# Patient Record
Sex: Male | Born: 1980 | Hispanic: Yes | Marital: Married | Smoking: Never smoker
Health system: Southern US, Community
[De-identification: ages and names within clinical notes are randomized; demographics above are authoritative.]

---

## 2007-05-14 ENCOUNTER — Emergency Department: Payer: Self-pay | Admitting: Emergency Medicine

## 2010-04-28 ENCOUNTER — Emergency Department: Payer: Self-pay | Admitting: Emergency Medicine

## 2015-10-01 ENCOUNTER — Emergency Department
Admission: EM | Admit: 2015-10-01 | Discharge: 2015-10-01 | Disposition: A | Discharge status: Home / Self Care | Payer: Self-pay | Attending: Emergency Medicine | Admitting: Emergency Medicine

## 2015-10-01 ENCOUNTER — Emergency Department: Payer: Self-pay

## 2015-10-01 ENCOUNTER — Encounter: Payer: Self-pay | Admitting: *Deleted

## 2015-10-01 DIAGNOSIS — R51 Headache: Secondary | ICD-10-CM | POA: Insufficient documentation

## 2015-10-01 DIAGNOSIS — G8929 Other chronic pain: Secondary | ICD-10-CM | POA: Insufficient documentation

## 2015-10-01 DIAGNOSIS — R519 Headache, unspecified: Secondary | ICD-10-CM

## 2015-10-01 MED ORDER — BUTALBITAL-APAP-CAFFEINE 50-325-40 MG PO TABS: 1.0000 | ORAL_TABLET | Freq: Four times a day (QID) | ORAL | Status: AC | PRN

## 2015-10-01 NOTE — ED Provider Notes (Signed)
[facility] Emergency Department Provider Note  ____________________________________________  Time seen: Approximately 1:34 PM  I have reviewed the triage vital signs and the nursing notes.   HISTORY  Chief Complaint Headache  HPI Troy Daniels is a 34 y.o. male is here complaining of posterior headache for 3 weeks. Patient states that every morning he wakes up with a headache and it lasts generally all day. He has been taking ibuprofen with minimal relief. He denies any nausea, vomiting or photophobia. He has not been seen for headaches prior to today. Patient is unaware of any heart problems as he does not have a primary care doctor. Patient denies any nasal congestion, ear pain, sore throat or sinus pain. Currently he rates his pain as 5 out of 10.   History reviewed. No pertinent past medical history.  There are no active problems to display for this patient.   History reviewed. No pertinent past surgical history.  Current Outpatient Rx  Name  Route  Sig  Dispense  Refill  . butalbital-acetaminophen-caffeine (FIORICET) 50-325-40 MG tablet   Oral   Take 1-2 tablets by mouth every 6 (six) hours as needed for headache.   20 tablet   0     Allergies Review of patient's allergies indicates no known allergies.  No family history on file.  Social History Social History  Substance Use Topics  . Smoking status: Never Smoker   . Smokeless tobacco: None  . Alcohol Use: Yes    Review of Systems Constitutional: No fever/chills Eyes: No visual changes. ENT: No sore throat. Cardiovascular: Denies chest pain. Respiratory: Denies shortness of breath. Gastrointestinal: No abdominal pain.  No nausea, no vomiting.  Genitourinary: Negative for dysuria. Musculoskeletal: Negative for back pain. Skin: Negative for rash. Neurological: Positive for headaches, no focal weakness or numbness.  10-point ROS otherwise  negative.  ____________________________________________   PHYSICAL EXAM:  VITAL SIGNS: ED Triage Vitals  Enc Vitals Group     BP 10/01/15 1252 138/97 mmHg     Pulse Rate 10/01/15 1252 92     Resp 10/01/15 1252 20     Temp 10/01/15 1250 98.6 F (37 C)     Temp Source 10/01/15 1250 Oral     SpO2 10/01/15 1252 97 %     Weight 10/01/15 1250 240 lb (108.863 kg)     Height 10/01/15 1250  (1.753 m)     Head Cir --      Peak Flow --      Pain Score 10/01/15 1251 5     Pain Loc --      Pain Edu? --      Excl. in GC? --     Constitutional: Alert and oriented. Well appearing and in no acute distress. Eyes: Conjunctivae are normal. PERRL. EOMI. Head: Atraumatic. Nose: No congestion/rhinnorhea. Mouth/Throat: Mucous membranes are moist.  Oropharynx non-erythematous. Neck: No stridor.  Supple. No cervical tenderness on palpation. Range of motion of the neck all 4 planes within normal limits. Hematological/Lymphatic/Immunilogical: No cervical lymphadenopathy. Cardiovascular: Normal rate, regular rhythm. Grossly normal heart sounds.  Good peripheral circulation. Respiratory: Normal respiratory effort.  No retractions. Lungs CTAB. Gastrointestinal: Soft and nontender. No distention.  Musculoskeletal: Moves upper and lower extremities without any difficulty. Normal gait was noted. Neurologic:  Normal speech and language. No gross focal neurologic deficits are appreciated. No gait instability. Cranial nerves II through XII grossly intact. Reflexes 2+ bilaterally. Skin:  Skin is warm, dry and intact. No rash noted. Psychiatric: Mood  and affect are normal. Speech and behavior are normal.  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 ____________________________________________   PROCEDURES  Procedure(s) performed: None  Critical Care performed: No  ____________________________________________   INITIAL IMPRESSION / ASSESSMENT AND PLAN / ED COURSE  Pertinent labs & imaging results that were available during my care of the patient were reviewed by me and considered in my medical decision making (see chart for details).  Patient was given a prescription for Fioricet to try to see if this helps with his headaches. He is to [facility] or the neurologist who is on call for today. Patient was made aware that he cannot drive or operate machinery while taking this medication. ____________________________________________   FINAL CLINICAL IMPRESSION(S) / ED DIAGNOSES  Final diagnoses:  Chronic nonintractable headache, unspecified headache type      Tommi RumpsRhonda L Jahari Wiginton, PA-C 10/01/15 1504  Sharman CheekPhillip Stafford, MD 10/01/15 1550

## 2015-10-01 NOTE — Discharge Instructions (Signed)
General Headache Without Cause A headache is pain or discomfort felt around the head or neck area. There are many causes and types of headaches. In some cases, the cause may not be found.  HOME CARE  Managing Pain  Take over-the-counter and prescription medicines only as told by your doctor.  Lie down in a dark, quiet room when you have a headache.  If directed, apply ice to the head and neck area:  Put ice in a plastic bag.  Place a towel between your skin and the bag.  Leave the ice on for 20 minutes, 2-3 times per day.  Use a heating pad or hot shower to apply heat to the head and neck area as told by your doctor.  Keep lights dim if bright lights bother you or make your headaches worse. Eating and Drinking  Eat meals on a regular schedule.  Lessen how much alcohol you drink.  Lessen how much caffeine you drink, or stop drinking caffeine. General Instructions  Keep all follow-up visits as told by your doctor. This is important.  Keep a journal to find out if certain things bring on headaches. For example, write down:  What you eat and drink.  How much sleep you get.  Any change to your diet or medicines.  Relax by getting a massage or doing other relaxing activities.  Lessen stress.  Sit up straight. Do not tighten (tense) your muscles.  Do not use tobacco products. This includes cigarettes, chewing tobacco, or e-cigarettes. If you need help quitting, ask your doctor.  Exercise regularly as told by your doctor.  Get enough sleep. This often means 7-9 hours of sleep. GET HELP IF:  Your symptoms are not helped by medicine.  You have a headache that feels different than the other headaches.  You feel sick to your stomach (nauseous) or you throw up (vomit).  You have a fever. GET HELP RIGHT AWAY IF:   Your headache becomes really bad.  You keep throwing up.  You have a stiff neck.  You have trouble seeing.  You have trouble speaking.  You have  pain in the eye or ear.  Your muscles are weak or you lose muscle control.  You lose your balance or have trouble walking.  You feel like you will pass out (faint) or you pass out.  You have confusion.   This information is not intended to replace advice given to you by your health care provider. Make sure you discuss any questions you have with your health care provider.   Document Released: 06/28/2008 Document Revised: 06/10/2015 Document Reviewed: 01/12/2015 Elsevier Interactive Patient Education 2016 [employer]    [facility] if any continued problems. There is also a neurologist listed on your discharge papers that he could follow-up with for your chronic headaches. Fioricet 1 or 2 every 6 hours if needed for headache.. Whether this could cause drowsiness and you should not be driving or operating machinery.

## 2015-10-01 NOTE — ED Notes (Signed)
Pt reports headache x 3 weeks in back of head.  Pt denies any injury, denies hx of migraines.  Pt states he wakes up every morning w/ headache and it last all day; reports taking ibuprofen w/ minimal relief.  Pt ambulatory to room; no immediate distress at this time.

## 2015-10-01 NOTE — ED Notes (Signed)
Past 3 weeks c/o intervals of headace back of head with some n/v, has headache now, took motrin with some relief, no vomiting today

## 2017-02-20 ENCOUNTER — Emergency Department
Admission: EM | Admit: 2017-02-20 | Discharge: 2017-02-20 | Disposition: A | Discharge status: Home / Self Care | Payer: No Typology Code available for payment source | Attending: Emergency Medicine | Admitting: Emergency Medicine

## 2017-02-20 ENCOUNTER — Emergency Department: Payer: No Typology Code available for payment source

## 2017-02-20 DIAGNOSIS — Y999 Unspecified external cause status: Secondary | ICD-10-CM | POA: Diagnosis not present

## 2017-02-20 DIAGNOSIS — S59902A Unspecified injury of left elbow, initial encounter: Secondary | ICD-10-CM | POA: Diagnosis present

## 2017-02-20 DIAGNOSIS — S5002XA Contusion of left elbow, initial encounter: Secondary | ICD-10-CM

## 2017-02-20 DIAGNOSIS — Y9366 Activity, soccer: Secondary | ICD-10-CM | POA: Insufficient documentation

## 2017-02-20 DIAGNOSIS — Y929 Unspecified place or not applicable: Secondary | ICD-10-CM | POA: Insufficient documentation

## 2017-02-20 DIAGNOSIS — S53402A Unspecified sprain of left elbow, initial encounter: Secondary | ICD-10-CM

## 2017-02-20 DIAGNOSIS — W1839XA Other fall on same level, initial encounter: Secondary | ICD-10-CM | POA: Insufficient documentation

## 2017-02-20 MED ORDER — HYDROCODONE-ACETAMINOPHEN 5-325 MG PO TABS
1.0000 | ORAL_TABLET | Freq: Once | ORAL | Status: AC
  Administered 2017-02-20: 1 via ORAL
  Filled 2017-02-20: qty 1

## 2017-02-20 MED ORDER — OXYCODONE-ACETAMINOPHEN 5-325 MG PO TABS
ORAL_TABLET | ORAL | Status: DC
Start: 2017-02-20 — End: 2017-02-20
  Filled 2017-02-20: qty 1

## 2017-02-20 MED ORDER — OXYCODONE-ACETAMINOPHEN 5-325 MG PO TABS
1.0000 | ORAL_TABLET | Freq: Once | ORAL | Status: AC
  Administered 2017-02-20: 1 via ORAL

## 2017-02-20 MED ORDER — TRAMADOL HCL 50 MG PO TABS: 50.0000 mg | ORAL_TABLET | Freq: Four times a day (QID) | ORAL | 0 refills | Status: AC | PRN

## 2017-02-20 NOTE — ED Notes (Signed)
Pt discharged to home.  Family member driving.  Discharge instructions reviewed.  Verbalized understanding.  No questions or concerns at this time.  Teach back verified.  Pt in NAD.  No items left in ED.   

## 2017-02-20 NOTE — ED Triage Notes (Signed)
Patient reports he fell onto left elbow at approx 1950 while playing soccer with his child. Pt reports pain of the entire left arm.

## 2017-02-21 NOTE — ED Provider Notes (Signed)
[facility] Emergency Department Provider Note   ____________________________________________   I have reviewed the triage vital signs and the nursing notes.   HISTORY  Chief Complaint Arm Injury (left arm)    HPI Troy Daniels is a 36 y.o. male presents with left elbow pain and inability to move his left arm since falling on his arm during a soccer match earlier this evening. Patient unable to recall how he landed on his arm. Sensation intact throughout his left upper extremity since the injury. Patient can range his wrist and shoulder however significantly increases elbow pain. Pt unable to fully flex, extend or supinate left elbow. Denies past injury to left upper extremity. Denies head, neck or back injury.    History reviewed. No pertinent past medical history.  There are no active problems to display for this patient.   History reviewed. No pertinent surgical history.  Prior to Admission medications   Medication Sig Start Date End Date Taking? Authorizing Provider  traMADol (ULTRAM) 50 MG tablet Take 1 tablet (50 mg total) by mouth every 6 (six) hours as needed. 02/20/17 02/20/18  [place] Witz, Jordan Likesraci M, PA-C    Allergies Patient has no known allergies.  History reviewed. No pertinent family history.  Social History Social History  Substance Use Topics  . Smoking status: Never Smoker  . Smokeless tobacco: Never Used  . Alcohol use Yes    Review of Systems Constitutional: Negative for fever/chills Eyes: No visual changes. Cardiovascular: Denies chest pain. Respiratory: Denies shortness of breath. Musculoskeletal: Left upper extremity pain, inability to move left elbow.  Skin: negative for rash. Neurological: Negative for headaches.  Negative focal weakness or numbness. Negative for loss of consciousness. Able  ____________________________________________   PHYSICAL EXAM:  VITAL SIGNS: ED Triage Vitals  Enc Vitals Group     BP 02/20/17  2135 122/84     Pulse Rate 02/20/17 2135 67     Resp 02/20/17 2135 20     Temp 02/20/17 2135 98.2 F (36.8 C)     Temp Source 02/20/17 2135 Oral     SpO2 02/20/17 2135 98 %     Weight 02/20/17 2129 240 lb (108.9 kg)     Height 02/20/17 2129 5\' 9"  (1.753 m)     Head Circumference --      Peak Flow --      Pain Score 02/20/17 2128 10     Pain Loc --      Pain Edu? --      Excl. in GC? --     Constitutional: Alert and oriented. Well appearing and in no acute distress.  Head: Normocephalic and atraumatic. Eyes: Conjunctivae are normal.  Cardiovascular: Normal rate, regular rhythm. Normal distal pulses. Respiratory: Normal respiratory effort.  Musculoskeletal: Full left shoulder and wrist ROM and strength intact. Left elbow pain, inability to fully extend, flex or supinate secondary to pain. Sensation intact left upper extremity.  Neurologic: Normal speech and language. No gross focal neurologic deficits are appreciated. Skin:  Skin is warm, dry and intact. No rash noted. Psychiatric: Mood and affect are normal. ____________________________________________   LABS (all labs ordered are listed, but only abnormal results are displayed)  Labs Reviewed - No data to display ____________________________________________  EKG none ____________________________________________  RADIOLOGY DG left elbow complete FINDINGS: There is no evidence of fracture, dislocation, or joint effusion. There is no evidence of arthropathy or other focal bone abnormality. Soft tissues are unremarkable.  IMPRESSION: Negative. ____________________________________________   PROCEDURES  Procedure(s)  performed: no    Critical Care performed: no ____________________________________________   INITIAL IMPRESSION / ASSESSMENT AND PLAN / ED COURSE  Pertinent labs & imaging results that were available during my care of the patient were reviewed by me and considered in my medical decision making (see  chart for details).  Patient presents with left elbow pain after landing on his left arm during a soccer match earlier this evening. Physical exam finding and imaging are reassuring for soft tissue injury and no acute fracture of the left elbow. Patient noted significant pain reduction with pain management provided during course of care. Patient will be prescribed Tramadol and recommended to transition to over the counter NSAIDS as pain improves. Patient / Family informed of clinical course, understand medical decision-making process, and agree with plan.  Patient was advised to follow up with Orthopedics and was also advised to return to the emergency department for symptoms that change or worsen.     ____________________________________________   FINAL CLINICAL IMPRESSION(S) / ED DIAGNOSES  Final diagnoses:  Elbow sprain, left, initial encounter  Contusion of left elbow, initial encounter       NEW MEDICATIONS STARTED DURING THIS VISIT:  Discharge Medication List as of 02/20/2017 11:39 PM    START taking these medications   Details  traMADol (ULTRAM) 50 MG tablet Take 1 tablet (50 mg total) by mouth every 6 (six) hours as needed., Starting Mon 02/20/2017, Until Tue 02/20/2018, Print         Note:  This document was prepared using Dragon voice recognition software and may include unintentional dictation errors.   Clois Comber, PA-C 02/21/17 1509    Jeanmarie Plant, MD 02/24/17 [phone]

## 2018-03-08 ENCOUNTER — Other Ambulatory Visit: Payer: Self-pay

## 2018-03-08 ENCOUNTER — Encounter: Payer: Self-pay | Admitting: Emergency Medicine

## 2018-03-08 ENCOUNTER — Emergency Department
Admission: EM | Admit: 2018-03-08 | Discharge: 2018-03-08 | Disposition: A | Discharge status: Home / Self Care | Payer: BLUE CROSS/BLUE SHIELD | Attending: Student in an Organized Health Care Education/Training Program | Admitting: Student in an Organized Health Care Education/Training Program

## 2018-03-08 DIAGNOSIS — R51 Headache: Secondary | ICD-10-CM | POA: Insufficient documentation

## 2018-03-08 DIAGNOSIS — R519 Headache, unspecified: Secondary | ICD-10-CM

## 2018-03-08 DIAGNOSIS — R410 Disorientation, unspecified: Secondary | ICD-10-CM | POA: Insufficient documentation

## 2018-03-08 LAB — COMPREHENSIVE METABOLIC PANEL
ALK PHOS: 71 U/L (ref 38–126)
ALT: 42 U/L (ref 17–63)
AST: 34 U/L (ref 15–41)
Albumin: 4.1 g/dL (ref 3.5–5.0)
Anion gap: 10 (ref 5–15)
BILIRUBIN TOTAL: 0.4 mg/dL (ref 0.3–1.2)
BUN: 15 mg/dL (ref 6–20)
CALCIUM: 8.9 mg/dL (ref 8.9–10.3)
CO2: 23 mmol/L (ref 22–32)
Chloride: 105 mmol/L (ref 101–111)
Creatinine, Ser: 0.81 mg/dL (ref 0.61–1.24)
GFR calc Af Amer: >60 mL/min (ref >60)
Glucose, Bld: 112 mg/dL — ABNORMAL HIGH (ref 65–99)
POTASSIUM: 3.6 mmol/L (ref 3.5–5.1)
Sodium: 138 mmol/L (ref 135–145)
TOTAL PROTEIN: 7.2 g/dL (ref 6.5–8.1)

## 2018-03-08 LAB — CBC WITH DIFFERENTIAL/PLATELET
BASOS ABS: 0 10*3/uL (ref 0–0.1)
Basophils Relative: 1 %
Eosinophils Absolute: 0.1 10*3/uL (ref 0–0.7)
Eosinophils Relative: 2 %
HEMATOCRIT: 45.1 % (ref 40.0–52.0)
Hemoglobin: 15.5 g/dL (ref 13.0–18.0)
LYMPHS PCT: 36 %
Lymphs Abs: 2.9 10*3/uL (ref 1.0–3.6)
MCH: 29.8 pg (ref 26.0–34.0)
MCHC: 34.3 g/dL (ref 32.0–36.0)
MCV: 86.8 fL (ref 80.0–100.0)
MONO ABS: 0.7 10*3/uL (ref 0.2–1.0)
Monocytes Relative: 9 %
NEUTROS ABS: 4.2 10*3/uL (ref 1.4–6.5)
Neutrophils Relative %: 52 %
Platelets: 256 10*3/uL (ref 150–440)
RBC: 5.19 MIL/uL (ref 4.40–5.90)
RDW: 13.9 % (ref 11.5–14.5)
WBC: 8 10*3/uL (ref 3.8–10.6)

## 2018-03-08 NOTE — Discharge Instructions (Signed)

## 2018-03-08 NOTE — ED Provider Notes (Signed)
[facility] Emergency Department Provider Note    First MD Initiated Contact with Patient 03/08/18 1151     (approximate)  I have reviewed the triage vital signs and the nursing notes.   HISTORY  Chief Complaint Headache    HPI Troy Daniels is a 37 y.o. male with a history of headaches presents to the ER stating that he is having intermittent headaches particular from the back of the head moving to the forehead for the past several weeks.  States he has had episodes like this for years.  Denies any headache at this moment.  States that earlier in the week he was driving to the bank but pulled in the wrong parking lot and felt very confused and was not sure how he got there.  There is no trauma.  Denies any numbness or tingling.  No fevers.  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  States he came to the ER today because he just wanted to be "checked out ".  Denies any chest pain, shortness of breath, neck pain, nausea or vomiting, blurry vision or any new recent medications.    History reviewed. No pertinent past medical history. History reviewed. No pertinent family history. History reviewed. No pertinent surgical history. There are no active problems to display for this patient.     Prior to Admission medications   Not on File    Allergies Patient has no known allergies.    Social History Social History   Tobacco Use  . Smoking status: Never Smoker  . Smokeless tobacco: Never Used  Substance Use Topics  . Alcohol use: Yes  . Drug use: No    Review of Systems Patient denies headaches, rhinorrhea, blurry vision, numbness, shortness of breath, chest pain, edema, cough, abdominal pain, nausea, vomiting, diarrhea, dysuria, fevers, rashes or hallucinations unless otherwise stated above in HPI. ____________________________________________   PHYSICAL EXAM:  VITAL SIGNS: Vitals:   03/08/18 1119  BP: 139/77    Pulse: 71  Resp: 14  Temp: 99.1 F (37.3 C)  SpO2: 93%    Constitutional: Alert and oriented.  Eyes: Conjunctivae are normal.  Head: Atraumatic. Nose: No congestion/rhinnorhea. Mouth/Throat: Mucous membranes are moist.   Neck: No stridor. Painless ROM.  Cardiovascular: Normal rate, regular rhythm. Grossly normal heart sounds.  Good peripheral circulation. Respiratory: Normal respiratory effort.  No retractions. Lungs CTAB. Gastrointestinal: Soft and nontender. No distention. No abdominal bruits. No CVA tenderness. Genitourinary:  Musculoskeletal: No lower extremity tenderness nor edema.  No joint effusions. Neurologic: CN- intact.  No facial droop, Normal FNF.  Normal heel to shin.  Sensation intact bilaterally. Normal speech and language. No gross focal neurologic deficits are appreciated. No gait instability. Skin:  Skin is warm, dry and intact. No rash noted. Psychiatric: Mood and affect are normal. Speech and behavior are normal.  ____________________________________________   LABS (all labs ordered are listed, but only abnormal results are displayed)  Results for orders placed or performed during the hospital encounter of 03/08/18 (from the past 24 hour(s))  CBC with Differential/Platelet     Status: None   Collection Time: 03/08/18 11:35 AM  Result Value Ref Range   WBC 8.0 3.8 - 10.6 K/uL   RBC 5.19 4.40 - 5.90 MIL/uL   Hemoglobin 15.5 13.0 - 18.0 g/dL   HCT 16.1 09.6 - 04.5 %   MCV 86.8 80.0 - 100.0 fL   MCH 29.8 26.0 - 34.0 pg   MCHC  34.3 32.0 - 36.0 g/dL   RDW 16.1 09.6 - 04.5 %   Platelets 256 150 - 440 K/uL   Neutrophils Relative % 52 %   Neutro Abs 4.2 1.4 - 6.5 K/uL   Lymphocytes Relative 36 %   Lymphs Abs 2.9 1.0 - 3.6 K/uL   Monocytes Relative 9 %   Monocytes Absolute 0.7 0.2 - 1.0 K/uL   Eosinophils Relative 2 %   Eosinophils Absolute 0.1 0 - 0.7 K/uL   Basophils Relative 1 %   Basophils Absolute 0.0 0 - 0.1 K/uL  Comprehensive metabolic panel      Status: Abnormal   Collection Time: 03/08/18 11:35 AM  Result Value Ref Range   Sodium 138 135 - 145 mmol/L   Potassium 3.6 3.5 - 5.1 mmol/L   Chloride 105 101 - 111 mmol/L   CO2 23 22 - 32 mmol/L   Glucose, Bld 112 (H) 65 - 99 mg/dL   BUN 15 6 - 20 mg/dL   Creatinine, Ser 4.09 0.61 - 1.24 mg/dL   Calcium 8.9 8.9 - 81.1 mg/dL   Total Protein 7.2 6.5 - 8.1 g/dL   Albumin 4.1 3.5 - 5.0 g/dL   AST 34 15 - 41 U/L   ALT 42 17 - 63 U/L   Alkaline Phosphatase 71 38 - 126 U/L   Total Bilirubin 0.4 0.3 - 1.2 mg/dL   GFR calc non Af Amer >60 >60 mL/min   GFR calc Af Amer >60 >60 mL/min   Anion gap 10 5 - 15   ____________________________________________ ____________________________________________  RADIOLOGY   ____________________________________________   PROCEDURES  Procedure(s) performed:  Procedures    Critical Care performed: no ____________________________________________   INITIAL IMPRESSION / ASSESSMENT AND PLAN / ED COURSE  Pertinent labs & imaging results that were available during my care of the patient were reviewed by me and considered in my medical decision making (see chart for details).   DDX: tension, cluster, migraine, unlikely sah, iph, mass, meningitis  Troy Daniels is a 37 y.o. who presents to the ED with with Hx of headaches p/w intermittent HA for last several days days. Not worst HA ever. Gradual onset. HA similar to previous episodes. Denies focal neurologic symptoms. Denies trauma. No fevers or neck pain. No vision loss. Afebrile in ED. VSS. Exam as above. No meningeal signs. No CN, motor, sensory or cerebellar deficits. Temporal arteries palpable and non-tender. Appears well and non-toxic.  Likely tension, non-specific or possible migraine HA. Clinical picture is not consistent with ICH, SAH, SDH, EDH, TIA, or CVA. No concern for meningitis or encephalitis. No concern for GCA/Temporal arteritis.  Normal neuro exam is again without focal deficit, nuchal  rigidity or evidence of meningeal irritation.  Stable to D/C home, follow up with PCP or Neurology if persistent recurrent Has.  Have discussed with the patient and available family all diagnostics and treatments performed thus far and all questions were answered to the best of my ability. The patient demonstrates understanding and agreement with plan.         As part of my medical decision making, I reviewed the following data within the electronic MEDICAL RECORD NUMBER Nursing notes reviewed and incorporated, Labs reviewed, notes from prior ED visits and [place] Controlled Substance Database   ____________________________________________   FINAL CLINICAL IMPRESSION(S) / ED DIAGNOSES  Final diagnoses:  Nonintractable episodic headache, unspecified headache type      NEW MEDICATIONS STARTED DURING THIS VISIT:  New Prescriptions   No medications on  file     Note:  This document was prepared using Dragon voice recognition software and may include unintentional dictation errors.    Willy Eddyobinson, Makyah Lavigne, MD 03/08/18 1220

## 2018-03-08 NOTE — ED Triage Notes (Signed)
Pt reports that he has been having headache for the last several weeks. He states that if he laughs he gets dizzy then the headache starts. Reports that last week he was going to a gas station and thinks that he may of passed out because when he came back to that he was in a bank and did not know how he got there.

## 2018-07-13 ENCOUNTER — Other Ambulatory Visit: Payer: Self-pay | Admitting: Sports Medicine

## 2018-07-13 DIAGNOSIS — M25561 Pain in right knee: Principal | ICD-10-CM

## 2018-07-13 DIAGNOSIS — G8929 Other chronic pain: Secondary | ICD-10-CM

## 2018-07-31 ENCOUNTER — Ambulatory Visit
Admission: RE | Admit: 2018-07-31 | Discharge: 2018-07-31 | Disposition: A | Discharge status: Home / Self Care | Payer: BLUE CROSS/BLUE SHIELD | Source: Ambulatory Visit | Attending: Sports Medicine | Admitting: Sports Medicine

## 2018-07-31 DIAGNOSIS — M25561 Pain in right knee: Secondary | ICD-10-CM | POA: Diagnosis not present

## 2018-07-31 DIAGNOSIS — G8929 Other chronic pain: Secondary | ICD-10-CM

## 2018-07-31 DIAGNOSIS — M94261 Chondromalacia, right knee: Secondary | ICD-10-CM | POA: Insufficient documentation

## 2018-07-31 DIAGNOSIS — M25461 Effusion, right knee: Secondary | ICD-10-CM | POA: Insufficient documentation

## 2018-09-16 ENCOUNTER — Encounter: Payer: Self-pay | Admitting: Emergency Medicine

## 2018-09-16 ENCOUNTER — Other Ambulatory Visit: Payer: Self-pay

## 2018-09-16 ENCOUNTER — Emergency Department
Admission: EM | Admit: 2018-09-16 | Discharge: 2018-09-16 | Discharge status: Against Medical Advice | Payer: BLUE CROSS/BLUE SHIELD | Attending: Emergency Medicine | Admitting: Emergency Medicine

## 2018-09-16 DIAGNOSIS — R109 Unspecified abdominal pain: Secondary | ICD-10-CM | POA: Insufficient documentation

## 2018-09-16 DIAGNOSIS — Z5321 Procedure and treatment not carried out due to patient leaving prior to being seen by health care provider: Secondary | ICD-10-CM | POA: Diagnosis not present

## 2018-09-16 LAB — COMPREHENSIVE METABOLIC PANEL
ALBUMIN: 4.8 g/dL (ref 3.5–5.0)
ALT: 29 U/L (ref 0–44)
AST: 25 U/L (ref 15–41)
Alkaline Phosphatase: 69 U/L (ref 38–126)
Anion gap: 8 (ref 5–15)
BUN: 12 mg/dL (ref 6–20)
CO2: 26 mmol/L (ref 22–32)
Calcium: 9 mg/dL (ref 8.9–10.3)
Chloride: 105 mmol/L (ref 98–111)
Creatinine, Ser: 0.9 mg/dL (ref 0.61–1.24)
GFR calc Af Amer: >60 mL/min (ref >60)
GFR calc non Af Amer: >60 mL/min (ref >60)
GLUCOSE: 99 mg/dL (ref 70–99)
POTASSIUM: 3.9 mmol/L (ref 3.5–5.1)
Sodium: 139 mmol/L (ref 135–145)
Total Bilirubin: 0.7 mg/dL (ref 0.3–1.2)
Total Protein: 8 g/dL (ref 6.5–8.1)

## 2018-09-16 LAB — CBC
HEMATOCRIT: 46.6 % (ref 39.0–52.0)
HEMOGLOBIN: 15.5 g/dL (ref 13.0–17.0)
MCH: 28.8 pg (ref 26.0–34.0)
MCHC: 33.3 g/dL (ref 30.0–36.0)
MCV: 86.5 fL (ref 80.0–100.0)
Platelets: 252 10*3/uL (ref 150–400)
RBC: 5.39 MIL/uL (ref 4.22–5.81)
RDW: 13.3 % (ref 11.5–15.5)
WBC: 8.1 10*3/uL (ref 4.0–10.5)
nRBC: 0 % (ref 0.0–0.2)

## 2018-09-16 LAB — LIPASE, BLOOD: Lipase: 25 U/L (ref 11–51)

## 2018-09-16 NOTE — ED Triage Notes (Signed)
Pt to ED via POV c/o abdominal pain that started 3 hours ago when he was at work. 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 Pt denies N/V/D. Pt is in NAD at this time.

## 2018-09-16 NOTE — ED Notes (Signed)
Pt called to be roomed twice by EMCORLorrie RN and once by [employer]ron TechnologyBrandy RN. No answer. Pt not in lobby.

## 2018-09-18 ENCOUNTER — Telehealth: Payer: Self-pay | Admitting: Emergency Medicine

## 2018-09-18 NOTE — Telephone Encounter (Signed)
Called patient due to lwot to inquire about condition and follow up plans. Says he feels better.  Will return if worse.

## 2019-06-06 ENCOUNTER — Other Ambulatory Visit: Payer: Self-pay

## 2019-06-06 DIAGNOSIS — Z20822 Contact with and (suspected) exposure to covid-19: Secondary | ICD-10-CM

## 2019-06-07 LAB — NOVEL CORONAVIRUS, NAA: SARS-CoV-2, NAA: NOT DETECTED

## 2019-06-14 ENCOUNTER — Emergency Department
Admission: EM | Admit: 2019-06-14 | Discharge: 2019-06-15 | Disposition: A | Discharge status: Home / Self Care | Payer: Self-pay | Attending: Emergency Medicine | Admitting: Emergency Medicine

## 2019-06-14 ENCOUNTER — Emergency Department: Payer: Self-pay

## 2019-06-14 ENCOUNTER — Encounter: Payer: Self-pay | Admitting: Emergency Medicine

## 2019-06-14 ENCOUNTER — Other Ambulatory Visit: Payer: Self-pay

## 2019-06-14 DIAGNOSIS — R059 Cough, unspecified: Secondary | ICD-10-CM

## 2019-06-14 DIAGNOSIS — Z20828 Contact with and (suspected) exposure to other viral communicable diseases: Secondary | ICD-10-CM | POA: Insufficient documentation

## 2019-06-14 DIAGNOSIS — R05 Cough: Secondary | ICD-10-CM | POA: Insufficient documentation

## 2019-06-14 DIAGNOSIS — Z20822 Contact with and (suspected) exposure to covid-19: Secondary | ICD-10-CM

## 2019-06-14 DIAGNOSIS — R079 Chest pain, unspecified: Secondary | ICD-10-CM | POA: Insufficient documentation

## 2019-06-14 DIAGNOSIS — R509 Fever, unspecified: Secondary | ICD-10-CM | POA: Insufficient documentation

## 2019-06-14 LAB — COMPREHENSIVE METABOLIC PANEL
ALT: 36 U/L (ref 0–44)
AST: 32 U/L (ref 15–41)
Albumin: 4 g/dL (ref 3.5–5.0)
Alkaline Phosphatase: 81 U/L (ref 38–126)
Anion gap: 9 (ref 5–15)
BUN: 11 mg/dL (ref 6–20)
CO2: 23 mmol/L (ref 22–32)
Calcium: 8.4 mg/dL — ABNORMAL LOW (ref 8.9–10.3)
Chloride: 105 mmol/L (ref 98–111)
Creatinine, Ser: 0.88 mg/dL (ref 0.61–1.24)
GFR calc Af Amer: >60 mL/min (ref >60)
GFR calc non Af Amer: >60 mL/min (ref >60)
Glucose, Bld: 131 mg/dL — ABNORMAL HIGH (ref 70–99)
Potassium: 3.3 mmol/L — ABNORMAL LOW (ref 3.5–5.1)
Sodium: 137 mmol/L (ref 135–145)
Total Bilirubin: 0.5 mg/dL (ref 0.3–1.2)
Total Protein: 7.6 g/dL (ref 6.5–8.1)

## 2019-06-14 LAB — CBC
HCT: 45.5 % (ref 39.0–52.0)
Hemoglobin: 15.5 g/dL (ref 13.0–17.0)
MCH: 29 pg (ref 26.0–34.0)
MCHC: 34.1 g/dL (ref 30.0–36.0)
MCV: 85 fL (ref 80.0–100.0)
Platelets: 176 10*3/uL (ref 150–400)
RBC: 5.35 MIL/uL (ref 4.22–5.81)
RDW: 13 % (ref 11.5–15.5)
WBC: 7.4 10*3/uL (ref 4.0–10.5)
nRBC: 0 % (ref 0.0–0.2)

## 2019-06-14 MED ORDER — ACETAMINOPHEN 325 MG PO TABS
650.0000 mg | ORAL_TABLET | Freq: Once | ORAL | Status: AC
  Administered 2019-06-14: 650 mg via ORAL
  Filled 2019-06-14: qty 2

## 2019-06-14 NOTE — ED Triage Notes (Signed)
Pt reports has been feeling flu like symptom for 2 weeks reports cough, nausea, vomiting, and generalized body aches, fatigued. [facility] does not have results. Pt reports chest pain when coughing. Pt talks in complete sentences no respiratory distress noted

## 2019-06-15 MED ORDER — BENZONATATE 100 MG PO CAPS: 100.0000 mg | ORAL_CAPSULE | Freq: Four times a day (QID) | ORAL | 0 refills | Status: AC | PRN

## 2019-06-15 MED ORDER — ALBUTEROL SULFATE HFA 108 (90 BASE) MCG/ACT IN AERS: 2.0000 | INHALATION_SPRAY | Freq: Four times a day (QID) | RESPIRATORY_TRACT | 0 refills | Status: AC | PRN

## 2019-06-15 NOTE — ED Provider Notes (Signed)
[facility] Emergency Department Provider Note  ____________________________________________   I have reviewed the triage vital signs and the nursing notes.   HISTORY  Chief Complaint Cough, Fever, and Generalized Body Aches   History limited by: Not Limited   HPI Troy Daniels is a 38 y.o. male who presents to the emergency department today because of concern for continued cough, chest pain, fevers, body aches. Patient sates symptoms have been present for roughly 9 days. Was tested for covid shortly after the symptoms started and it was negative. Went to urgent care on 9/10 and was retested, although results are pending. Patient was given prescription for antibiotics for possible bacterial infection. Patient states that he has not started feeling better. Has been taking mucinex for cough.    Records reviewed. Per medical record review patient has a history of urgent care visit on 9/10  History reviewed. No pertinent past medical history.  There are no active problems to display for this patient.   History reviewed. No pertinent surgical history.  Prior to Admission medications   Not on File    Allergies Patient has no known allergies.  No family history on file.  Social History Social History   Tobacco Use  . Smoking status: Never Smoker  . Smokeless tobacco: Never Used  Substance Use Topics  . Alcohol use: Yes  . Drug use: No    Review of Systems Constitutional: Positive for fever. Eyes: No visual changes. ENT: Positive for sore throat. Cardiovascular: Positive for chest pain. Respiratory: Positive for shortness of breath. Gastrointestinal: No abdominal pain.  Positive for nausea.  Genitourinary: Negative for dysuria. Musculoskeletal: Positive for body aches. Skin: Negative for rash. Neurological: Negative for focal weakness or numbness.  ____________________________________________   PHYSICAL EXAM:  VITAL SIGNS: ED Triage  Vitals [06/14/19 1912]  Enc Vitals Group     BP 129/80     Pulse Rate (!) 105     Resp (!) 21     Temp (!) 102.3 F (39.1 C)     Temp Source Oral     SpO2 97 %     Weight 245 lb (111.1 kg)     Height 5\' 9"  (1.753 m)     Head Circumference      Peak Flow      Pain Score 0   Constitutional: Alert and oriented.  Eyes: Conjunctivae are normal.  ENT      Head: Normocephalic and atraumatic.      Nose: No congestion/rhinnorhea.      Mouth/Throat: Mucous membranes are moist.      Neck: No stridor. Respiratory: Normal respiratory effort without tachypnea nor retractions. Genitourinary: Deferred Musculoskeletal: Normal range of motion in all extremities. Neurologic:  Normal speech and language. No gross focal neurologic deficits are appreciated.  Psychiatric: Mood and affect are normal. Speech and behavior are normal. Patient exhibits appropriate insight and judgment.  ____________________________________________    LABS (pertinent positives/negatives)  CBC wbc 7.4, hgb 15.5, plt 176 CMP wnl except k 3.3, glu 131, ca 8.4  ____________________________________________   EKG  I, Phineas SemenGraydon Kevontae Burgoon, attending physician, personally viewed and interpreted this EKG  EKG Time: 1925 Rate: 99 Rhythm: normal sinus rhythm Axis: normal Intervals: qtc 431 QRS: narrow ST changes: no st elevation Impression: normal ekg ____________________________________________    RADIOLOGY  CXR Multifocal opacities, patchy airspace disease  ____________________________________________   PROCEDURES  Procedures  ____________________________________________   INITIAL IMPRESSION / ASSESSMENT AND PLAN / ED COURSE  Pertinent labs & imaging  results that were available during my care of the patient were reviewed by me and considered in my medical decision making (see chart for details).   Patient presented to the emergency department today with signs and symptoms consistent with COVID.  Patient  did have fever here.  Patient's chest x-ray is consistent with COVID.  Patient was seen in urgent care essentially yesterday and was started on antibiotics for possible bacterial source.  He was tested for COVID.  Discussed with the patient COVID and symptoms.  Did discuss that we want and expect significant improvement on antibiotics both because it is viral illness and also he has not been on them for very long.  Discussed with patient that he might continue to feel poorly for weeks with COVID.  Will give albuterol inhaler and Tessalon Perles to help with symptoms.  Discussed importance of self quarantine.  ____________________________________________   FINAL CLINICAL IMPRESSION(S) / ED DIAGNOSES  Final diagnoses:  Suspected 2019 Novel Coronavirus Infection     Note: This dictation was prepared with Dragon dictation. Any transcriptional errors that result from this process are unintentional     Nance Pear, MD 06/15/19 0045

## 2020-04-01 ENCOUNTER — Telehealth: Payer: Self-pay | Admitting: General Practice

## 2020-04-01 NOTE — Telephone Encounter (Signed)
Called to inform individual they are no longer eligible in our clinic due to having Insurance.

## 2020-11-18 IMAGING — DX DG CHEST 1V PORT
1 series · 1 of 1 positions shown · non-contrast
Comparison: None.

CLINICAL DATA: Cough.

EXAM:
PORTABLE CHEST 1 VIEW

[chest ap]
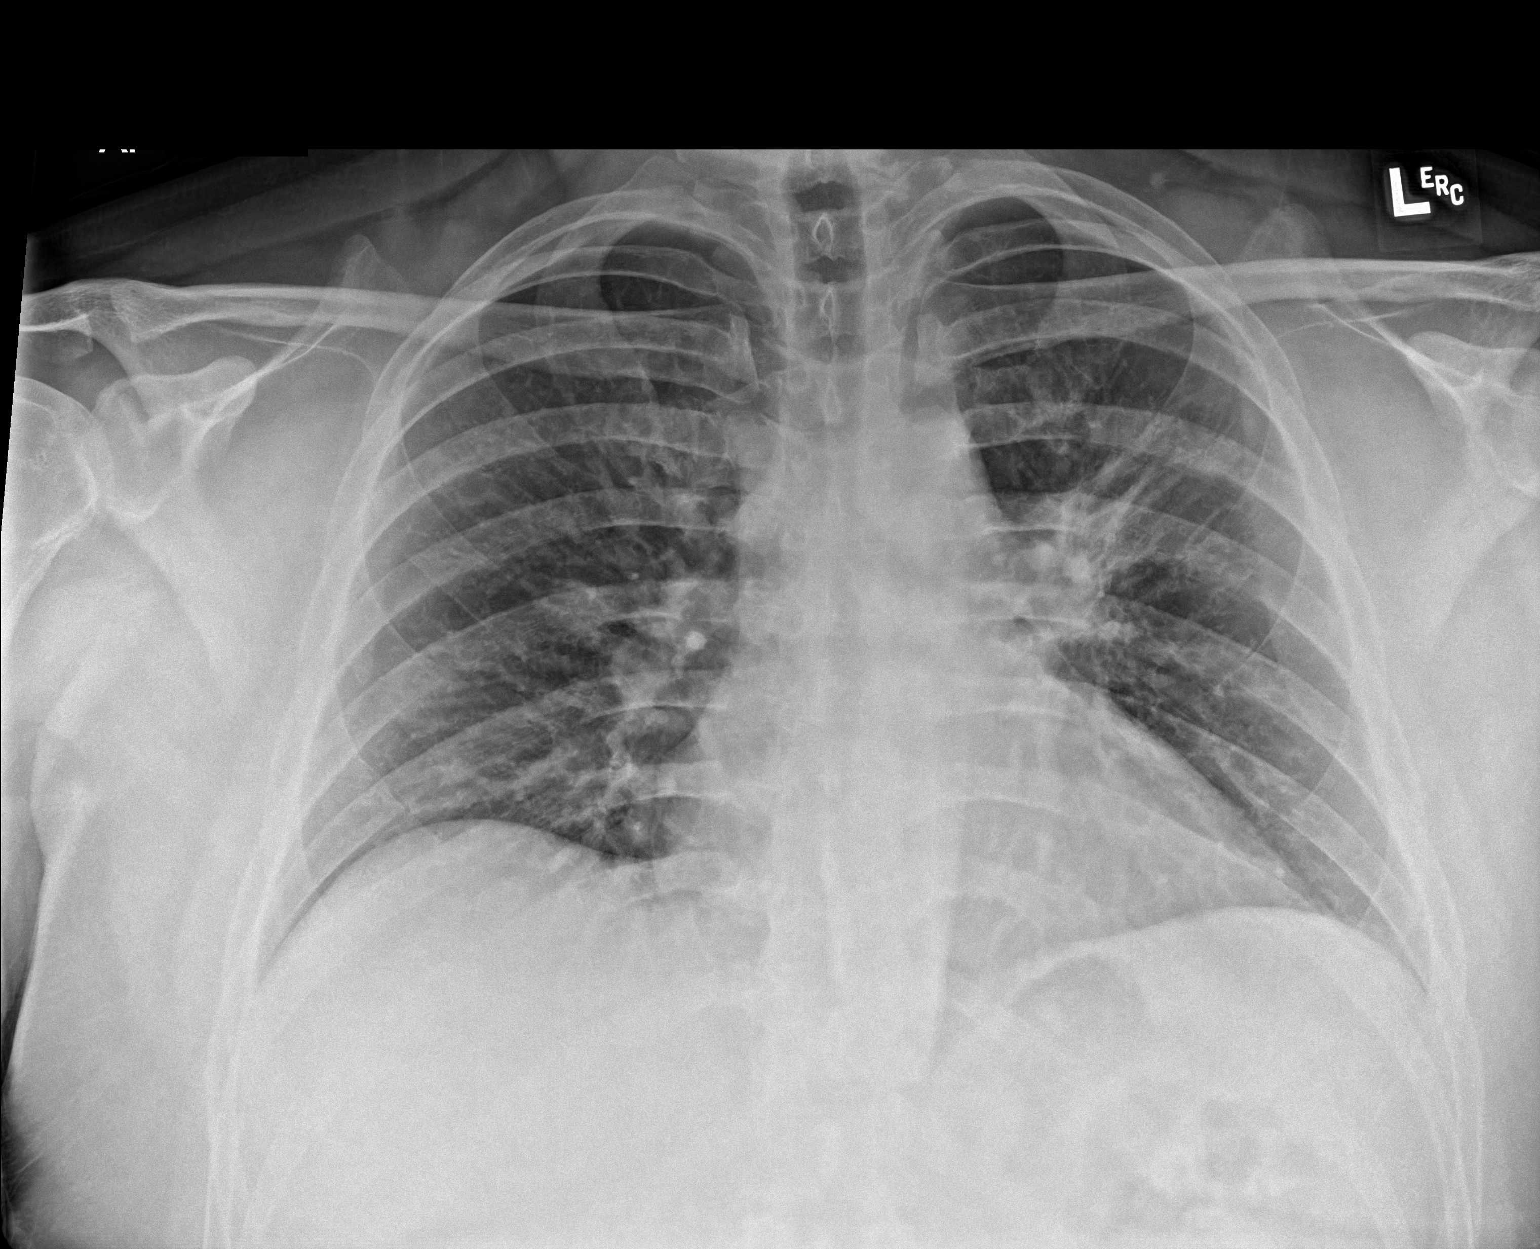

[1 of 1 positions shown; findings below may reference images not displayed]

FINDINGS: Low lung volumes. Patchy left perihilar opacity with additional
vague opacities at both lung bases. Normal heart size and
mediastinal contours. No pulmonary edema, pleural effusion, or
pneumothorax. No acute osseous abnormalities are seen.
IMPRESSION: Patchy left perihilar and VB bibasilar opacities concerning for
pneumonia in the setting of cough and fever, including atypical
viral organisms.

## 2021-03-22 ENCOUNTER — Emergency Department
Admission: EM | Admit: 2021-03-22 | Discharge: 2021-03-22 | Disposition: A | Discharge status: Home / Self Care | Payer: 59 | Attending: Emergency Medicine | Admitting: Emergency Medicine

## 2021-03-22 ENCOUNTER — Encounter: Payer: Self-pay | Admitting: Emergency Medicine

## 2021-03-22 ENCOUNTER — Other Ambulatory Visit: Payer: Self-pay

## 2021-03-22 DIAGNOSIS — L500 Allergic urticaria: Secondary | ICD-10-CM | POA: Diagnosis not present

## 2021-03-22 DIAGNOSIS — T7840XA Allergy, unspecified, initial encounter: Secondary | ICD-10-CM | POA: Diagnosis not present

## 2021-03-22 DIAGNOSIS — L509 Urticaria, unspecified: Secondary | ICD-10-CM

## 2021-03-22 MED ORDER — DEXAMETHASONE SODIUM PHOSPHATE 10 MG/ML IJ SOLN
10.0000 mg | Freq: Once | INTRAMUSCULAR | Status: AC
  Administered 2021-03-22: 10 mg via INTRAVENOUS
  Filled 2021-03-22: qty 1

## 2021-03-22 MED ORDER — PREDNISONE 10 MG PO TABS: 10.0000 mg | ORAL_TABLET | Freq: Every day | ORAL | 0 refills | Status: AC

## 2021-03-22 MED ORDER — DIPHENHYDRAMINE HCL 50 MG/ML IJ SOLN
25.0000 mg | Freq: Once | INTRAMUSCULAR | Status: AC
  Administered 2021-03-22: 25 mg via INTRAVENOUS
  Filled 2021-03-22: qty 1

## 2021-03-22 MED ORDER — FAMOTIDINE IN NACL 20-0.9 MG/50ML-% IV SOLN
20.0000 mg | Freq: Once | INTRAVENOUS | Status: AC
  Administered 2021-03-22: 20 mg via INTRAVENOUS
  Filled 2021-03-22: qty 50

## 2021-03-22 NOTE — ED Provider Notes (Signed)
[facility] EMERGENCY DEPARTMENT Provider Note   CSN: [id number] Arrival date & time: 03/22/21  1757     History Chief Complaint  Patient presents with   Allergic Reaction    Troy Daniels is a 40 y.o. male.  Presents to the emergency department for evaluation of allergic reaction.  Patient states 40 minutes prior to arrival Troy Daniels developed rash throughout his body with hives.  Denies any facial swelling, difficulty breathing, wheezing, chest pain or shortness of breath.  Troy Daniels has had erythematous pruritic rash throughout the arms, legs neck and chest.  Troy Daniels took 25 mg of Benadryl earlier with very little relief.  Troy Daniels had a similar reaction 4 months ago.  Both reactions occurred with eating a cheeseburger.  No history of anaphylaxis.  HPI     History reviewed. No pertinent past medical history.  There are no problems to display for this patient.   History reviewed. No pertinent surgical history.     No family history on file.  Social History   Tobacco Use   Smoking status: Never   Smokeless tobacco: Never  Substance Use Topics   Alcohol use: Yes   Drug use: No    Home Medications Prior to Admission medications   Medication Sig Start Date End Date Taking? Authorizing Provider  predniSONE (DELTASONE) 10 MG tablet Take 1 tablet (10 mg total) by mouth daily. 6,5,4,3,2,1 six day taper 03/22/21  Yes Evon Slack, PA-C  albuterol (VENTOLIN HFA) 108 (90 Base) MCG/ACT inhaler Inhale 2 puffs into the lungs every 6 (six) hours as needed for wheezing or shortness of breath. Suspected covid 06/15/19   Phineas Semen, MD    Allergies    Patient has no known allergies.  Review of Systems   Review of Systems  Constitutional:  Negative for chills and fever.  HENT:  Negative for facial swelling, mouth sores, nosebleeds, sinus pressure and sinus pain.   Respiratory:  Negative for shortness of breath, wheezing and stridor.   Cardiovascular:  Negative for chest  pain.  Gastrointestinal:  Negative for diarrhea, nausea and vomiting.  Genitourinary:  Negative for flank pain and frequency.  Musculoskeletal:  Negative for back pain.  Skin:  Positive for rash.  Neurological:  Negative for weakness, numbness and headaches.   Physical Exam Updated Vital Signs BP (!) 136/98 (BP Location: Left Arm)   Pulse (!) 107   Temp 97.9 F (36.6 C) (Oral)   Resp 16   Ht 5\' 9"  (1.753 m)   Wt 111.1 kg   SpO2 98%   BMI 36.17 kg/m   Physical Exam Constitutional:      Appearance: Troy Daniels is well-developed.  HENT:     Head: Normocephalic and atraumatic.     Right Ear: Ear canal normal. There is no impacted cerumen.     Left Ear: Ear canal normal. There is no impacted cerumen.     Nose: Nose normal. No congestion or rhinorrhea.     Mouth/Throat:     Mouth: Mucous membranes are moist.     Pharynx: No posterior oropharyngeal erythema.  Eyes:     Conjunctiva/sclera: Conjunctivae normal.  Cardiovascular:     Rate and Rhythm: Normal rate.     Pulses: Normal pulses.  Pulmonary:     Effort: Pulmonary effort is normal. No respiratory distress.  Abdominal:     General: Abdomen is flat. There is no distension.     Palpations: Abdomen is soft.     Tenderness: There is no abdominal  tenderness. There is no guarding.  Musculoskeletal:        General: Normal range of motion.     Cervical back: Normal range of motion.  Skin:    General: Skin is warm.     Findings: Erythema and rash present.     Comments: Positive for urticarial type rash along the upper extremities, lower extremities.  No facial rash, swelling.  Rash 90% better with IV medications.  Neurological:     General: No focal deficit present.     Mental Status: Troy Daniels is alert and oriented to person, place, and time. Mental status is at baseline.  Psychiatric:        Behavior: Behavior normal.        Thought Content: Thought content normal.    ED Results / Procedures / Treatments   Labs (all labs ordered are  listed, but only abnormal results are displayed) Labs Reviewed - No data to display  EKG None  Radiology No results found.  Procedures Procedures   Medications Ordered in ED Medications  dexamethasone (DECADRON) injection 10 mg (10 mg Intravenous Given 03/22/21 1849)  diphenhydrAMINE (BENADRYL) injection 25 mg (25 mg Intravenous Given 03/22/21 1849)  famotidine (PEPCID) IVPB 20 mg premix (20 mg Intravenous New Bag/Given 03/22/21 1849)    ED Course  I have reviewed the triage vital signs and the nursing notes.  Pertinent labs & imaging results that were available during my care of the patient were reviewed by me and considered in my medical decision making (see chart for details).    MDM Rules/Calculators/A&P                          40 year old male with rash to the upper and lower extremities.  Rash occurred after eating a cheeseburger.  Had a similar rash that occurred after eating a cheeseburger 4 months ago.  No history of anaphylaxis.  Vital signs stable, no respiratory distress rash 90% better with IV dexamethasone, Zantac, Benadryl.  Patient given a 6-day steroid taper to start tomorrow if Troy Daniels continues to have rash.  Recommend follow-up with allergist. Final Clinical Impression(s) / ED Diagnoses Final diagnoses:  Allergic reaction, initial encounter  Hives    Rx / DC Orders ED Discharge Orders          Ordered    predniSONE (DELTASONE) 10 MG tablet  Daily        03/22/21 2005             Ronnette Juniper 03/22/21 2024    Shaune Pollack, MD 03/25/21 1149

## 2021-03-22 NOTE — ED Triage Notes (Signed)
C/O rash to body x 20 minutes.  Maybe from a cheeseburger.  Benadryl taken, no improvement.  AAOx3. Skin warm and dry.  Itchy rash seen to arms and legs. Voice clear and strong.  No SOB.  NAD

## 2021-03-22 NOTE — Discharge Instructions (Addendum)
Please follow-up with allergist.  Avoid red meat.  Please start prednisone taper tomorrow.  If any worsening rash, facial swelling, difficulty swallowing, shortness of breath return to the ER immediately.

## 2023-05-13 ENCOUNTER — Emergency Department: Payer: BC Managed Care – PPO

## 2023-05-13 ENCOUNTER — Emergency Department
Admission: EM | Admit: 2023-05-13 | Discharge: 2023-05-13 | Disposition: A | Discharge status: Home / Self Care | Payer: BC Managed Care – PPO | Attending: Emergency Medicine | Admitting: Emergency Medicine

## 2023-05-13 ENCOUNTER — Other Ambulatory Visit: Payer: Self-pay

## 2023-05-13 DIAGNOSIS — R059 Cough, unspecified: Secondary | ICD-10-CM | POA: Diagnosis not present

## 2023-05-13 DIAGNOSIS — R0602 Shortness of breath: Secondary | ICD-10-CM | POA: Insufficient documentation

## 2023-05-13 DIAGNOSIS — R42 Dizziness and giddiness: Secondary | ICD-10-CM | POA: Insufficient documentation

## 2023-05-13 DIAGNOSIS — R110 Nausea: Secondary | ICD-10-CM | POA: Diagnosis not present

## 2023-05-13 DIAGNOSIS — R0981 Nasal congestion: Secondary | ICD-10-CM | POA: Insufficient documentation

## 2023-05-13 DIAGNOSIS — Z20822 Contact with and (suspected) exposure to covid-19: Secondary | ICD-10-CM | POA: Diagnosis not present

## 2023-05-13 DIAGNOSIS — J111 Influenza due to unidentified influenza virus with other respiratory manifestations: Secondary | ICD-10-CM

## 2023-05-13 DIAGNOSIS — R509 Fever, unspecified: Secondary | ICD-10-CM | POA: Insufficient documentation

## 2023-05-13 LAB — GROUP A STREP BY PCR: Group A Strep by PCR: NOT DETECTED

## 2023-05-13 LAB — BASIC METABOLIC PANEL
Anion gap: 9 (ref 5–15)
BUN: 12 mg/dL (ref 6–20)
CO2: 23 mmol/L (ref 22–32)
Calcium: 8.8 mg/dL — ABNORMAL LOW (ref 8.9–10.3)
Chloride: 105 mmol/L (ref 98–111)
Creatinine, Ser: 0.94 mg/dL (ref 0.61–1.24)
GFR, Estimated: >60 mL/min (ref >60)
Glucose, Bld: 103 mg/dL — ABNORMAL HIGH (ref 70–99)
Potassium: 3.9 mmol/L (ref 3.5–5.1)
Sodium: 137 mmol/L (ref 135–145)

## 2023-05-13 LAB — CBC WITH DIFFERENTIAL/PLATELET
Abs Immature Granulocytes: 0.09 10*3/uL — ABNORMAL HIGH (ref 0.00–0.07)
Basophils Absolute: 0.1 10*3/uL (ref 0.0–0.1)
Basophils Relative: 0 %
Eosinophils Absolute: 0.1 10*3/uL (ref 0.0–0.5)
Eosinophils Relative: 1 %
HCT: 45.2 % (ref 39.0–52.0)
Hemoglobin: 15.2 g/dL (ref 13.0–17.0)
Immature Granulocytes: 1 %
Lymphocytes Relative: 11 %
Lymphs Abs: 1.8 10*3/uL (ref 0.7–4.0)
MCH: 29 pg (ref 26.0–34.0)
MCHC: 33.6 g/dL (ref 30.0–36.0)
MCV: 86.3 fL (ref 80.0–100.0)
Monocytes Absolute: 1.2 10*3/uL — ABNORMAL HIGH (ref 0.1–1.0)
Monocytes Relative: 7 %
Neutro Abs: 13.7 10*3/uL — ABNORMAL HIGH (ref 1.7–7.7)
Neutrophils Relative %: 80 %
Platelets: 215 10*3/uL (ref 150–400)
RBC: 5.24 MIL/uL (ref 4.22–5.81)
RDW: 13.3 % (ref 11.5–15.5)
WBC: 17 10*3/uL — ABNORMAL HIGH (ref 4.0–10.5)
nRBC: 0 % (ref 0.0–0.2)

## 2023-05-13 LAB — URINALYSIS, W/ REFLEX TO CULTURE (INFECTION SUSPECTED)
Bacteria, UA: NONE SEEN
Bilirubin Urine: NEGATIVE
Glucose, UA: NEGATIVE mg/dL
Ketones, ur: NEGATIVE mg/dL
Leukocytes,Ua: NEGATIVE
Nitrite: NEGATIVE
Protein, ur: NEGATIVE mg/dL
Specific Gravity, Urine: 1.014 (ref 1.005–1.030)
pH: 5 (ref 5.0–8.0)

## 2023-05-13 LAB — RESP PANEL BY RT-PCR (RSV, FLU A&B, COVID)  RVPGX2
Influenza A by PCR: NEGATIVE
Influenza B by PCR: NEGATIVE
Resp Syncytial Virus by PCR: NEGATIVE
SARS Coronavirus 2 by RT PCR: NEGATIVE

## 2023-05-13 LAB — LACTIC ACID, PLASMA: Lactic Acid, Venous: 0.8 mmol/L (ref 0.5–1.9)

## 2023-05-13 LAB — TROPONIN I (HIGH SENSITIVITY): Troponin I (High Sensitivity): 4 ng/L (ref <18)

## 2023-05-13 MED ORDER — DOXYCYCLINE MONOHYDRATE 100 MG PO TABS: 100.0000 mg | ORAL_TABLET | Freq: Two times a day (BID) | ORAL | 0 refills | Status: AC

## 2023-05-13 MED ORDER — ONDANSETRON 4 MG PO TBDP: 4.0000 mg | ORAL_TABLET | Freq: Three times a day (TID) | ORAL | 0 refills | Status: AC | PRN

## 2023-05-13 MED ORDER — SODIUM CHLORIDE 0.9 % IV BOLUS
1000.0000 mL | Freq: Once | INTRAVENOUS | Status: AC
  Administered 2023-05-13: 1000 mL via INTRAVENOUS

## 2023-05-13 MED ORDER — IBUPROFEN 600 MG PO TABS
600.0000 mg | ORAL_TABLET | Freq: Once | ORAL | Status: AC
  Administered 2023-05-13: 600 mg via ORAL
  Filled 2023-05-13: qty 1

## 2023-05-13 NOTE — ED Triage Notes (Signed)
Pt reports thinks he has covid. Pt c/o bodyaches, cough, fever, and congestion since Thursday. Denies exposures.

## 2023-05-13 NOTE — Discharge Instructions (Signed)
You had a high fever and a elevated heart rate while you are in the emergency department.  This came down with ibuprofen and also fluids.  Your chest x-ray, urinalysis, and blood tests do not reveal an obvious source of your infection.  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  However, if your symptoms worsen or if you develop a rash, or any other change in your symptoms or concerns, return to the emergency department immediately.  Please also follow-up with your primary care provider this week.  Sometimes the doxycycline can make you nauseous, therefore you were given a prescription for Zofran to take as needed if you become nauseous.  Do not take more Zofran than prescribed.  Please return for any new, worsening, or changing symptoms or other concerns.  It was a pleasure caring for you today.

## 2023-05-13 NOTE — ED Provider Notes (Signed)
[facility] Provider Note    None    (approximate)   History   Cough, Generalized Body Aches, and Fever   HPI  Troy Daniels is a 42 y.o. male who presents today for evaluation of cough, nasal congestion, lightheadedness, fever for the past 3 days.  Patient reports today feels exactly the same as when he was diagnosed with COVID-19 and that is what he thinks he has today.  He reports that he recently took down some trees 1 week ago and got poison ivy to his bilateral arms and torso.  He feels that this is improving.  He has not had a rash elsewhere.  He has not noticed any tick bites nor other insect bites.  He reports that he feels slightly short of breath with his cough.  No abdominal pain, vomiting, diarrhea.  He does report mild nausea.  No neck pain or stiffness.  There are no problems to display for this patient.         Physical Exam   Triage Vital Signs: ED Triage Vitals [05/13/23 1007]  Encounter Vitals Group     BP      Systolic BP Percentile      Diastolic BP Percentile      Pulse      Resp      Temp      Temp src      SpO2      Weight 235 lb (106.6 kg)     Height 5\' 9"  (1.753 m)     Head Circumference      Peak Flow      Pain Score 6     Pain Loc      Pain Education      Exclude from Growth Chart     Most recent vital signs: Vitals:   05/13/23 1011 05/13/23 1225  BP: (!) 143/92 126/74  Pulse: (!) 119 95  Resp: 20 20  Temp: (!) 102.3 F (39.1 C) 99.1 F (37.3 C)  SpO2: 97% 97%    Physical Exam Vitals and nursing note reviewed.  Constitutional:      General: Awake and alert. No acute distress.    Appearance: Normal appearance. The patient is obese.  HENT:     Head: Normocephalic and atraumatic.     Mouth: Mucous membranes are moist. Uvula midline.  No tonsillar exudate.  No soft palate fluctuance.  No trismus.  No voice change.  No sublingual swelling.  No tender cervical lymphadenopathy.  No nuchal rigidity Eyes:      General: PERRL. Normal EOMs        Right eye: No discharge.        Left eye: No discharge.     Conjunctiva/sclera: Conjunctivae normal.  Cardiovascular:     Rate and Rhythm: Tachycardic rate and regular rhythm.     Pulses: Normal pulses.  Pulmonary:     Effort: Pulmonary effort is normal. No respiratory distress.     Breath sounds: Normal breath sounds.  Abdominal:     Abdomen is soft. There is no abdominal tenderness. No rebound or guarding. No distention. Musculoskeletal:        General: No swelling. Normal range of motion.     Cervical back: Normal range of motion and neck supple. No meningismus  Skin:    General: Skin is warm and dry.     Capillary Refill: Capillary refill takes less than 2 seconds.     Findings: Mild vesicular type rash  to bilateral forearms and low abdomen, blanching where he reports that he was exposed to poison ivy.  No purpura or petechiae noted. Neurological:     Mental Status: The patient is awake and alert.      ED Results / Procedures / Treatments   Labs (all labs ordered are listed, but only abnormal results are displayed) Labs Reviewed  BASIC METABOLIC PANEL - Abnormal; Notable for the following components:      Result Value   Glucose, Bld 103 (*)    Calcium 8.8 (*)    All other components within normal limits  CBC WITH DIFFERENTIAL/PLATELET - Abnormal; Notable for the following components:   WBC 17.0 (*)    Neutro Abs 13.7 (*)    Monocytes Absolute 1.2 (*)    Abs Immature Granulocytes 0.09 (*)    All other components within normal limits  URINALYSIS, W/ REFLEX TO CULTURE (INFECTION SUSPECTED) - Abnormal; Notable for the following components:   Color, Urine YELLOW (*)    APPearance CLEAR (*)    Hgb urine dipstick SMALL (*)    All other components within normal limits  RESP PANEL BY RT-PCR (RSV, FLU A&B, COVID)  RVPGX2  GROUP A STREP BY PCR  CULTURE, BLOOD (ROUTINE X 2)  CULTURE, BLOOD (ROUTINE X 2)  LACTIC ACID, PLASMA  LACTIC ACID,  PLASMA  TROPONIN I (HIGH SENSITIVITY)     EKG     RADIOLOGY I independently reviewed and interpreted imaging and agree with radiologists findings.     PROCEDURES:  Critical Care performed:   Procedures   MEDICATIONS ORDERED IN ED: Medications  sodium chloride 0.9 % bolus 1,000 mL (0 mLs Intravenous Stopped 05/13/23 1226)  ibuprofen (ADVIL) tablet 600 mg (600 mg Oral Given 05/13/23 1035)     IMPRESSION / MDM / ASSESSMENT AND PLAN / ED COURSE  I reviewed the triage vital signs and the nursing notes.   Differential diagnosis includes, but is not limited to, COVID-19, influenza, RSV, bronchitis, pneumonia, tickborne illness, myocarditis.  Patient is awake and alert, febrile to 102.3 F and tachycardic to 119 on arrival.  He is normotensive.  He has normal oxygen saturation of 97% on room air.  He is nontoxic in appearance.  Further workup is indicated.  Labs were obtained which reveal leukocytosis to 17.  Normal platelets.  Normal lactate.  Chest x-ray is without cardiopulmonary consolidation.  Urinalysis does not reveal evidence of infection.  Patient was treated with ibuprofen given that he had taken Tylenol prior to arrival, and a liter of normal saline with improvement of his vital signs.  Upon reevaluation he reports that he feels significantly improved.  There is not a clear source of his infection today.  He does not have any headache or meningeal signs to suggest meningitis.  He has no altered mental status or encephalopathy.  He has not had any vomiting or visual changes.  He has no light sensitivity.  He has no abdominal tenderness on exam or complaints of abdominal pain to suggest intra-abdominal infection.  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  There is not appear to be a superimposed cellulitis to his poison ivy today.  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  Patient has not seen  any ticks on him this season, but agrees that they are prevalent in this area and agrees with empiric treatment.  He was prescribed doxycycline, and also given a prescription for  Zofran in case he becomes nauseous with the doxycycline.  We also discussed the possibility of an illness that has not yet declared itself, and he was instructed to return to the emergency department for any new, worsening, or change in symptoms.  We discussed very strict return precautions for signs of progressive disease or new symptoms, or if his symptoms are not improving.  I recommend that he follow-up with his outpatient provider within the next couple of days for reevaluation.  Patient understands and agrees with plan.  He was discharged in stable condition.  Case was discussed with Dr. Fanny Bien who agrees with assessment and plan.   Patient's presentation is most consistent with acute presentation with potential threat to life or bodily function.   Clinical Course as of 05/13/23 1238  Sat May 13, 2023  1231 Patient reports that he feels significantly improved. [JP]    Clinical Course User Index [JP] Casondra Gasca, Herb Grays, PA-C     FINAL CLINICAL IMPRESSION(S) / ED DIAGNOSES   Final diagnoses:  Influenza-like illness     Rx / DC Orders   ED Discharge Orders          Ordered    doxycycline (ADOXA) 100 MG tablet  2 times daily        05/13/23 1232    ondansetron (ZOFRAN-ODT) 4 MG disintegrating tablet  Every 8 hours PRN        05/13/23 1232             Note:  This document was prepared using Dragon voice recognition software and may include unintentional dictation errors.   Jackelyn Hoehn, PA-C 05/13/23 1238    Sharyn Creamer, MD 05/14/23 1427

## 2023-05-18 LAB — CULTURE, BLOOD (ROUTINE X 2)
Culture: NO GROWTH
Culture: NO GROWTH
Special Requests: ADEQUATE

## 2023-09-28 ENCOUNTER — Encounter: Payer: Self-pay | Admitting: Emergency Medicine

## 2023-09-28 ENCOUNTER — Other Ambulatory Visit: Payer: Self-pay

## 2023-09-28 DIAGNOSIS — Z5321 Procedure and treatment not carried out due to patient leaving prior to being seen by health care provider: Secondary | ICD-10-CM | POA: Insufficient documentation

## 2023-09-28 DIAGNOSIS — R519 Headache, unspecified: Secondary | ICD-10-CM | POA: Insufficient documentation

## 2023-09-28 DIAGNOSIS — R6884 Jaw pain: Secondary | ICD-10-CM | POA: Insufficient documentation

## 2023-09-28 DIAGNOSIS — M79602 Pain in left arm: Secondary | ICD-10-CM | POA: Insufficient documentation

## 2023-09-28 NOTE — ED Triage Notes (Signed)
Patient ambulatory to triage with steady gait, without difficulty or distress noted; pt reports since Tuesday having left sided/occipital HA; today began having pain to left jaw and into left arm; denies hx of same

## 2023-09-29 ENCOUNTER — Emergency Department
Admission: EM | Admit: 2023-09-29 | Discharge: 2023-09-29 | Discharge status: Against Medical Advice | Payer: Self-pay | Attending: Emergency Medicine | Admitting: Emergency Medicine

## 2023-09-29 LAB — URINALYSIS, ROUTINE W REFLEX MICROSCOPIC
Bilirubin Urine: NEGATIVE
Glucose, UA: NEGATIVE mg/dL
Hgb urine dipstick: NEGATIVE
Ketones, ur: NEGATIVE mg/dL
Leukocytes,Ua: NEGATIVE
Nitrite: NEGATIVE
Protein, ur: NEGATIVE mg/dL
Specific Gravity, Urine: 1.015 (ref 1.005–1.030)
pH: 6 (ref 5.0–8.0)

## 2023-09-29 LAB — CBC
HCT: 49.5 % (ref 39.0–52.0)
Hemoglobin: 16.6 g/dL (ref 13.0–17.0)
MCH: 29.1 pg (ref 26.0–34.0)
MCHC: 33.5 g/dL (ref 30.0–36.0)
MCV: 86.7 fL (ref 80.0–100.0)
Platelets: 270 10*3/uL (ref 150–400)
RBC: 5.71 MIL/uL (ref 4.22–5.81)
RDW: 13.8 % (ref 11.5–15.5)
WBC: 10.4 10*3/uL (ref 4.0–10.5)
nRBC: 0 % (ref 0.0–0.2)

## 2023-09-29 LAB — BASIC METABOLIC PANEL
Anion gap: 12 (ref 5–15)
BUN: 12 mg/dL (ref 6–20)
CO2: 26 mmol/L (ref 22–32)
Calcium: 9.1 mg/dL (ref 8.9–10.3)
Chloride: 102 mmol/L (ref 98–111)
Creatinine, Ser: 1.14 mg/dL (ref 0.61–1.24)
GFR, Estimated: >60 mL/min (ref >60)
Glucose, Bld: 117 mg/dL — ABNORMAL HIGH (ref 70–99)
Potassium: 3.3 mmol/L — ABNORMAL LOW (ref 3.5–5.1)
Sodium: 140 mmol/L (ref 135–145)

## 2023-09-29 LAB — TROPONIN I (HIGH SENSITIVITY): Troponin I (High Sensitivity): 9 ng/L (ref <18)

## 2023-09-29 NOTE — ED Notes (Signed)
No answer when called several times from lobby
